# Patient Record
Sex: Female | Born: 1935 | Race: White | Hispanic: No
Health system: Southern US, Community
[De-identification: ages and names within clinical notes are randomized; demographics above are authoritative.]

---

## 2004-06-14 ENCOUNTER — Ambulatory Visit: Payer: Self-pay | Admitting: Internal Medicine

## 2005-05-31 ENCOUNTER — Ambulatory Visit: Payer: Self-pay | Admitting: Urology

## 2005-08-04 ENCOUNTER — Ambulatory Visit: Payer: Self-pay | Admitting: Internal Medicine

## 2006-08-07 ENCOUNTER — Ambulatory Visit: Payer: Self-pay | Admitting: Internal Medicine

## 2006-08-15 ENCOUNTER — Ambulatory Visit: Payer: Self-pay | Admitting: Internal Medicine

## 2007-11-07 ENCOUNTER — Ambulatory Visit: Payer: Self-pay | Admitting: Internal Medicine

## 2008-04-02 ENCOUNTER — Ambulatory Visit: Payer: Self-pay | Admitting: Internal Medicine

## 2008-12-03 ENCOUNTER — Ambulatory Visit: Payer: Self-pay | Admitting: Internal Medicine

## 2009-04-01 ENCOUNTER — Ambulatory Visit: Payer: Self-pay | Admitting: Orthopedic Surgery

## 2009-06-18 ENCOUNTER — Emergency Department: Payer: Self-pay | Admitting: Emergency Medicine

## 2009-12-16 ENCOUNTER — Ambulatory Visit: Payer: Self-pay | Admitting: Internal Medicine

## 2011-01-22 ENCOUNTER — Emergency Department: Payer: Self-pay | Admitting: Emergency Medicine

## 2011-06-08 ENCOUNTER — Inpatient Hospital Stay: Payer: Self-pay | Admitting: Specialist

## 2011-06-08 LAB — COMPREHENSIVE METABOLIC PANEL
Alkaline Phosphatase: 101 U/L (ref 50–136)
BUN: 21 mg/dL — ABNORMAL HIGH (ref 7–18)
Bilirubin,Total: 0.3 mg/dL (ref 0.2–1.0)
Calcium, Total: 8.8 mg/dL (ref 8.5–10.1)
Chloride: 101 mmol/L (ref 98–107)
Co2: 24 mmol/L (ref 21–32)
Creatinine: 1.2 mg/dL (ref 0.60–1.30)
EGFR (African American): 47 — ABNORMAL LOW
EGFR (Non-African Amer.): 41 — ABNORMAL LOW
Osmolality: 275 (ref 275–301)
SGOT(AST): 21 U/L (ref 15–37)
SGPT (ALT): 19 U/L
Total Protein: 7.7 g/dL (ref 6.4–8.2)

## 2011-06-08 LAB — CBC
HCT: 31.7 % — ABNORMAL LOW (ref 35.0–47.0)
MCH: 26.7 pg (ref 26.0–34.0)
MCHC: 31.9 g/dL — ABNORMAL LOW (ref 32.0–36.0)
Platelet: 314 10*3/uL (ref 150–440)
RDW: 15.3 % — ABNORMAL HIGH (ref 11.5–14.5)

## 2011-06-08 LAB — URINALYSIS, COMPLETE
Bacteria: NONE SEEN
Blood: NEGATIVE
Glucose,UR: NEGATIVE mg/dL (ref 0–75)
Ketone: NEGATIVE
Leukocyte Esterase: NEGATIVE
Nitrite: NEGATIVE
Protein: NEGATIVE
RBC,UR: 2 /HPF (ref 0–5)
Specific Gravity: 1.012 (ref 1.003–1.030)
Squamous Epithelial: <1
WBC UR: <1 /HPF (ref 0–5)

## 2011-06-08 LAB — PROTIME-INR
INR: 1.2
Prothrombin Time: 15.5 secs — ABNORMAL HIGH (ref 11.5–14.7)

## 2011-06-09 LAB — BASIC METABOLIC PANEL
Anion Gap: 12 (ref 7–16)
Calcium, Total: 8.3 mg/dL — ABNORMAL LOW (ref 8.5–10.1)
EGFR (African American): 46 — ABNORMAL LOW
EGFR (Non-African Amer.): 40 — ABNORMAL LOW
Glucose: 126 mg/dL — ABNORMAL HIGH (ref 65–99)
Osmolality: 271 (ref 275–301)
Potassium: 3.8 mmol/L (ref 3.5–5.1)

## 2011-06-09 LAB — CBC WITH DIFFERENTIAL/PLATELET
Basophil %: 0.6 %
Eosinophil %: 2.9 %
HCT: 29.6 % — ABNORMAL LOW (ref 35.0–47.0)
HGB: 9.3 g/dL — ABNORMAL LOW (ref 12.0–16.0)
Lymphocyte #: 1.4 10*3/uL (ref 1.0–3.6)
Lymphocyte %: 13.1 %
MCH: 26.8 pg (ref 26.0–34.0)
MCV: 85 fL (ref 80–100)
Monocyte #: 0.8 x10 3/mm (ref 0.2–0.9)
Neutrophil #: 8.3 10*3/uL — ABNORMAL HIGH (ref 1.4–6.5)
RBC: 3.48 10*6/uL — ABNORMAL LOW (ref 3.80–5.20)
RDW: 15.5 % — ABNORMAL HIGH (ref 11.5–14.5)
WBC: 10.9 10*3/uL (ref 3.6–11.0)

## 2011-06-10 LAB — CBC WITH DIFFERENTIAL/PLATELET
Basophil %: 0.5 %
Eosinophil %: 4.2 %
HCT: 25.4 % — ABNORMAL LOW (ref 35.0–47.0)
MCH: 26.9 pg (ref 26.0–34.0)
MCV: 85 fL (ref 80–100)
Monocyte %: 8.4 %
Platelet: 193 10*3/uL (ref 150–440)
WBC: 8.7 10*3/uL (ref 3.6–11.0)

## 2011-06-10 LAB — BASIC METABOLIC PANEL
Anion Gap: 10 (ref 7–16)
Calcium, Total: 7.6 mg/dL — ABNORMAL LOW (ref 8.5–10.1)
Chloride: 101 mmol/L (ref 98–107)
Co2: 24 mmol/L (ref 21–32)
EGFR (Non-African Amer.): 39 — ABNORMAL LOW
Glucose: 105 mg/dL — ABNORMAL HIGH (ref 65–99)
Osmolality: 273 (ref 275–301)
Potassium: 3.4 mmol/L — ABNORMAL LOW (ref 3.5–5.1)

## 2011-06-11 LAB — CBC WITH DIFFERENTIAL/PLATELET
Basophil #: 0.3 10*3/uL — ABNORMAL HIGH (ref 0.0–0.1)
Basophil %: 2.1 %
Eosinophil %: 2.5 %
HCT: 29.5 % — ABNORMAL LOW (ref 35.0–47.0)
HGB: 9.2 g/dL — ABNORMAL LOW (ref 12.0–16.0)
MCHC: 31.3 g/dL — ABNORMAL LOW (ref 32.0–36.0)
MCV: 84 fL (ref 80–100)
Monocyte #: 0.8 x10 3/mm (ref 0.2–0.9)
Monocyte %: 6.4 %
Neutrophil %: 74.7 %
RBC: 3.51 10*6/uL — ABNORMAL LOW (ref 3.80–5.20)
RDW: 15.7 % — ABNORMAL HIGH (ref 11.5–14.5)

## 2011-06-11 LAB — BASIC METABOLIC PANEL
Anion Gap: 11 (ref 7–16)
Calcium, Total: 8.4 mg/dL — ABNORMAL LOW (ref 8.5–10.1)
EGFR (African American): 49 — ABNORMAL LOW
EGFR (Non-African Amer.): 42 — ABNORMAL LOW
Osmolality: 275 (ref 275–301)
Sodium: 136 mmol/L (ref 136–145)

## 2011-08-06 ENCOUNTER — Emergency Department: Payer: Self-pay | Admitting: Emergency Medicine

## 2011-08-08 ENCOUNTER — Emergency Department: Payer: Self-pay | Admitting: Unknown Physician Specialty

## 2011-09-20 ENCOUNTER — Emergency Department: Payer: Self-pay | Admitting: *Deleted

## 2011-12-14 ENCOUNTER — Observation Stay: Payer: Self-pay | Admitting: Internal Medicine

## 2011-12-14 LAB — BASIC METABOLIC PANEL
Anion Gap: 5 — ABNORMAL LOW (ref 7–16)
BUN: 38 mg/dL — ABNORMAL HIGH (ref 7–18)
Calcium, Total: 9 mg/dL (ref 8.5–10.1)
Co2: 27 mmol/L (ref 21–32)
Creatinine: 1.29 mg/dL (ref 0.60–1.30)
EGFR (African American): 43 — ABNORMAL LOW
EGFR (Non-African Amer.): 37 — ABNORMAL LOW
Sodium: 134 mmol/L — ABNORMAL LOW (ref 136–145)

## 2011-12-14 LAB — URINALYSIS, COMPLETE
Bacteria: NONE SEEN
Glucose,UR: NEGATIVE mg/dL (ref 0–75)
Leukocyte Esterase: NEGATIVE
Ph: 6 (ref 4.5–8.0)
Protein: NEGATIVE
RBC,UR: 5 /HPF (ref 0–5)
Specific Gravity: 1.017 (ref 1.003–1.030)

## 2011-12-14 LAB — CBC
HCT: 33.5 % — ABNORMAL LOW (ref 35.0–47.0)
HGB: 10.8 g/dL — ABNORMAL LOW (ref 12.0–16.0)
MCV: 81 fL (ref 80–100)
Platelet: 326 10*3/uL (ref 150–440)
RBC: 4.14 10*6/uL (ref 3.80–5.20)
RDW: 16.4 % — ABNORMAL HIGH (ref 11.5–14.5)
WBC: 9 10*3/uL (ref 3.6–11.0)

## 2011-12-15 LAB — BASIC METABOLIC PANEL
Anion Gap: 8 (ref 7–16)
Calcium, Total: 8.9 mg/dL (ref 8.5–10.1)
Co2: 25 mmol/L (ref 21–32)
EGFR (African American): 47 — ABNORMAL LOW
EGFR (Non-African Amer.): 41 — ABNORMAL LOW
Glucose: 94 mg/dL (ref 65–99)
Osmolality: 283 (ref 275–301)

## 2011-12-15 LAB — CBC WITH DIFFERENTIAL/PLATELET
Basophil #: 0.1 10*3/uL (ref 0.0–0.1)
Eosinophil #: 0.2 10*3/uL (ref 0.0–0.7)
Eosinophil %: 2.8 %
Lymphocyte %: 25.9 %
MCH: 26.4 pg (ref 26.0–34.0)
MCHC: 32.8 g/dL (ref 32.0–36.0)
Monocyte #: 0.8 x10 3/mm (ref 0.2–0.9)
Neutrophil %: 60.9 %
Platelet: 267 10*3/uL (ref 150–440)
RBC: 3.47 10*6/uL — ABNORMAL LOW (ref 3.80–5.20)
RDW: 16 % — ABNORMAL HIGH (ref 11.5–14.5)
WBC: 8.5 10*3/uL (ref 3.6–11.0)

## 2011-12-16 LAB — CBC WITH DIFFERENTIAL/PLATELET
Basophil #: 0.1 10*3/uL (ref 0.0–0.1)
Basophil %: 0.7 %
Eosinophil #: 0.2 10*3/uL (ref 0.0–0.7)
HGB: 10.1 g/dL — ABNORMAL LOW (ref 12.0–16.0)
Lymphocyte #: 2.4 10*3/uL (ref 1.0–3.6)
MCH: 27.6 pg (ref 26.0–34.0)
MCHC: 34 g/dL (ref 32.0–36.0)
MCV: 81 fL (ref 80–100)
Monocyte #: 1.1 x10 3/mm — ABNORMAL HIGH (ref 0.2–0.9)
Neutrophil #: 6 10*3/uL (ref 1.4–6.5)
Neutrophil %: 60.9 %
Platelet: 263 10*3/uL (ref 150–440)
RBC: 3.68 10*6/uL — ABNORMAL LOW (ref 3.80–5.20)
RDW: 16.1 % — ABNORMAL HIGH (ref 11.5–14.5)

## 2012-09-19 LAB — BASIC METABOLIC PANEL
BUN: 29 mg/dL — ABNORMAL HIGH (ref 7–18)
Chloride: 101 mmol/L (ref 98–107)
Co2: 24 mmol/L (ref 21–32)
Glucose: 102 mg/dL — ABNORMAL HIGH (ref 65–99)
Osmolality: 272 (ref 275–301)
Sodium: 133 mmol/L — ABNORMAL LOW (ref 136–145)

## 2012-09-19 LAB — URINALYSIS, COMPLETE
Ketone: NEGATIVE
Nitrite: NEGATIVE
Protein: NEGATIVE
WBC UR: 108 /HPF (ref 0–5)

## 2012-09-19 LAB — CBC
HGB: 13.7 g/dL (ref 12.0–16.0)
MCH: 28.5 pg (ref 26.0–34.0)
MCV: 86 fL (ref 80–100)
RBC: 4.8 10*6/uL (ref 3.80–5.20)
RDW: 15.7 % — ABNORMAL HIGH (ref 11.5–14.5)

## 2012-09-20 ENCOUNTER — Observation Stay: Payer: Self-pay

## 2012-09-20 LAB — TROPONIN I: Troponin-I: 0.03 ng/mL

## 2012-09-20 LAB — CK TOTAL AND CKMB (NOT AT ARMC): CK, Total: 64 U/L (ref 21–215)

## 2012-09-21 LAB — CBC WITH DIFFERENTIAL/PLATELET
Basophil #: 0 10*3/uL (ref 0.0–0.1)
Basophil %: 0.7 %
Eosinophil #: 0.3 10*3/uL (ref 0.0–0.7)
Eosinophil %: 3.9 %
HCT: 36.5 % (ref 35.0–47.0)
HGB: 12.3 g/dL (ref 12.0–16.0)
Lymphocyte #: 1.9 10*3/uL (ref 1.0–3.6)
Lymphocyte %: 28.5 %
MCH: 29.2 pg (ref 26.0–34.0)
MCHC: 33.7 g/dL (ref 32.0–36.0)
Monocyte #: 0.6 x10 3/mm (ref 0.2–0.9)
Neutrophil %: 57.7 %
Platelet: 144 10*3/uL — ABNORMAL LOW (ref 150–440)
RBC: 4.2 10*6/uL (ref 3.80–5.20)
RDW: 16 % — ABNORMAL HIGH (ref 11.5–14.5)

## 2012-09-21 LAB — BASIC METABOLIC PANEL
Anion Gap: 6 — ABNORMAL LOW (ref 7–16)
Chloride: 106 mmol/L (ref 98–107)
EGFR (African American): 51 — ABNORMAL LOW
Glucose: 115 mg/dL — ABNORMAL HIGH (ref 65–99)
Osmolality: 274 (ref 275–301)
Potassium: 4.2 mmol/L (ref 3.5–5.1)
Sodium: 136 mmol/L (ref 136–145)

## 2012-09-22 LAB — URINE CULTURE

## 2014-05-06 NOTE — H&P (Signed)
PATIENT NAME:  Chelsea Bowen, Chelsea Bowen MR#:  [id number] DATE OF BIRTH:  1921/10/01  DATE OF ADMISSION:  12/14/2011  PRIMARY CARE PHYSICIAN: Clydie Braunavid Fitzgerald, MD, [facility]    CHIEF COMPLAINT: Head injury resulting in acute subarachnoid hemorrhage status post fall.   HISTORY OF PRESENT ILLNESS: This is an 81 year old female who lives at assisted living facility with recurrent history of falls who came in status post fall causing a head injury. CT scan showed acute subarachnoid hemorrhage. She is alert and she is oriented and without any new neurological deficits. She is answering questions appropriately. I spoke to the ER physician who stated that she spoke to the neurosurgeon at [facility] who did not recommend transfer. No procedure to be done for her. She is DNR/DNI. She is a high risk candidate. Therefore, she is not a surgical candidate. We were asked to admit the patient for observation and for comfort care.   REVIEW OF SYSTEMS: No history of fever, nausea, vomiting, diarrhea, chest pain, or shortness of breath.   PAST MEDICAL HISTORY:  1. History of recurrent falls. 2. History of hypertension. 3. History of glaucoma. 4. History of constipation. 5. History of anxiety/depression.   PAST SURGICAL HISTORY: Noncontributory.   FAMILY HISTORY: Noncontributory.   SOCIAL HISTORY: She lives at [facility] unassisted living facility. Not a smoker. Not a drinker.   PHYSICAL EXAMINATION:   VITAL SIGNS: Temperature 97.5, pulse 72, blood pressure 139/61, respiratory rate 22, 96% on room air.   GENERAL: This is an elderly female who is alert, who is oriented in no apparent distress.   HEENT: She has a contusion of the right forehead with acute laceration that has been repaired. Extraocular movements are intact. Pupils are equal and reactive to light and accommodation. Vision grossly intact. Normal oral exam without any trauma.   NECK: Supple. No tenderness to palpation of the cervical processes.    CARDIOVASCULAR: Regular rate and rhythm.   RESPIRATORY: Clear to auscultation bilaterally. No increased work of breathing.   ABDOMEN: Soft, nontender.   NEUROLOGICAL: 5 out of 5 strength throughout. Sensation grossly intact. No focal deficits.   SKIN: As stated above she has a laceration of her left forehead that has been repaired.   PERTINENT LABS: CT of the head showed an acute subarachnoid hemorrhage over the frontal and to lesser parietal convexities. No evidence of subdural, epidural, or parenchymal hemorrhage. There is no mass effect present. There is no evidence of acute skull fracture.   Sodium 134, potassium 4.6, creatinine 1.26, glucose 106, white blood cell count 9, hemoglobin 10.8, platelets 326. Urinalysis was negative.   ASSESSMENT AND PLAN:  1. Acute subarachnoid hemorrhage. The patient is not a surgical candidate. She is hemodynamically stable, neurologically stable at this time. Plan to admit for observation and for comfort care at this time. No intervention necessary. No further imaging at this time. Will try to evaluate her neurological status but will try to avoid neuro checks as we are trying to make her comfort as she is a DNR/DNI.  2. Hypertension. She is stable. Continue her home blood pressure regimen at this time.  3. Depression/anxiety. Continue her home sertraline.  4. Glaucoma. Continue home regimen.   DISPOSITION: She is being admitted for observation and for COMFORT CARE. Will decide whether or not to do physical therapy for evaluation. Family does not want her to have any physical therapy or plans to go to skilled nursing facility. They want her to go home as soon as  possible if she is stable overnight. Will reassess in the morning.   ____________________________ [name], MD kl:drc D: 12/14/2011 19:36:06 ET T: 12/15/2011 07:42:32 ET JOB#: [id number]  cc: [name], MD, <Dictator> [name] MD ELECTRONICALLY SIGNED 12/28/2011  12:17

## 2014-05-06 NOTE — Discharge Summary (Signed)
PATIENT NAME:  Chelsea Bowen, Chelsea Bowen MR#:  [id number] DATE OF BIRTH:  21-Nov-1921  DATE OF ADMISSION:  12/14/2011 DATE OF DISCHARGE:  12/16/2011  DISCHARGE DIAGNOSES: 1. Acute subarachnoid hemorrhage.  2. Hypertension.  3. Depression.  4. History of glaucoma.  5. History of osteoporosis.  6. Anxiety.   CHIEF COMPLAINT: Head injury resulting in subarachnoid hemorrhage.   HISTORY OF PRESENT ILLNESS: Chelsea Bowen is an 81 year old female lives in an assisted living facility, with history of recurrent falls and came in after a fall and after she sustained a head injury. CT scan showed evidence of acute subarachnoid hemorrhage; however, she was alert and oriented without any new neurological deficit. She was answering questions appropriately. She is a DO NOT RESUSCITATE, and she was also a high-risk surgical candidate.   PAST MEDICAL HISTORY: Significant for: 1. Hypertension.  2. Glaucoma.  3. Constipation.  4. Anxiety.  5. Depression.  6. Recurrent falls.   PHYSICAL EXAMINATION:  VITAL SIGNS: Temperature 97.5, pulse was 72, blood pressure 139/61, respirations 22, oxygen saturation 96% on room air.   GENERAL: She was not in distress. She had a contusion of the right forehead and a laceration which had been repaired on the right temporoparietal area.   NECK: Supple.   HEART: S1, S2.   ABDOMEN: Soft and nontender.   NEUROLOGIC: No focal deficits. Sensation was intact.   LABORATORY, DIAGNOSTIC AND RADIOLOGICAL DATA: CT scan of the head showed an acute subarachnoid hemorrhage of the frontal and lesser parietal convexities. No evidence of subdural epidural or parenchymal hemorrhage. There was no evidence of any mass effect. There was no evidence of any skull fracture. Sodium was 134, potassium 4.6, creatinine 1.26, glucose 106. WBC count 9, hemoglobin 10.8, platelets 326.0. Urinalysis was negative. A repeat hemoglobin was 10.1.   HOSPITAL COURSE: The patient was observed overnight and she appeared  to be stable during her stay. Discussions were held with family, who preferred to take her back to the assisted living facility. Her HCTZ was held because of hypotension.   DISCHARGE MEDICATIONS: She was advised to continue her home medications including: 1. Lisinopril 5 mg once a day.  2. Vitamin B12 1000 mcg once a day.  3. PreserVision 1 tablet b.i.d.  4. Colace 100 mg p.o. b.i.d.  5. Haldol 0.5 mg tablet every two to four hours p.Bowen.n. for agitation.  6. Ferrate  240 mg, one tablet a day.  7. Travatan eye drops, one drop each affected eye once a day.  8. Os-Cal 1500 mg b.i.d.  9. Sertraline 50 mg p.o. daily.   FOLLOWUP: She has been advised to follow up with Dr. Sampson GoonFitzgerald in one to two 2 weeks' time.   CONDITION ON DISCHARGE: The patient is stable at the time of discharge.    Total time spent in discharge and co ordination of care: 35 minutes ____________________________ Barbette ReichmannVishwanath Katelin Kutsch, MD vh:cbb D: 12/17/2011 11:51:50 ET T: 12/17/2011 12:09:26 ET JOB#: [id number] Barbette ReichmannVISHWANATH Deberah Adolf MD ELECTRONICALLY SIGNED 12/19/2011 13:52

## 2014-05-09 NOTE — Discharge Summary (Signed)
PATIENT NAME:  Chelsea Bowen, Carolynn R MR#:  [id number] DATE OF BIRTH:  03-08-1921  DATE OF ADMISSION:  09/20/2012 DATE OF DISCHARGE:  09/21/2012  DISCHARGE DIAGNOSES: 1.  Acute encephalopathy.  2.  Fall.  3.  Urinary tract infection.  4.  Dehydration.  5.  Leukocytosis.   HISTORY OF PRESENT ILLNESS: Please see admission history and physical for details. Briefly, this is an 81 year old patient with advanced dementia who lives at [facility]. She is usually unable to walk and uses a wheelchair. She has had increasing confusion and agitation. She had also fallen and hit her head when she fell from her wheelchair. She had CT scans and x-rays done with no obvious injury. However, she was noted to have a urinary tract infection and leukocytosis and tachycardia. She also had elevated BUN and creatinine ratio and was volume depleted.   HOSPITAL COURSE BY ISSUE:  1.  Urinary tract infection. She received ceftriaxone and responded well with a white count down from 14 to 6. She had no fevers. She will be discharged on 7 days of Cipro.  2.  Volume depletion. She responded well to IV fluids with normalization of her BUN and creatinine ratio. She is taking orals and will be encouraged to continue p.o.  3.  Acute delirium. This is on top of baseline advanced dementia. She was given Ativan in the ER, and the daughter reports that that was much better than the Haldol she has been using at her nursing facility. We will recommend changing her to Ativan at bedtime and also p.r.n. every 8 hours as needed for agitation.  4.  A-fib. Her heart rate was elevated but this stabilized. No further intervention was necessary. 5.  Pain. The patient is on hospice at the nursing facility. She was continued on her Norco as well as her morphine. Her pain was adequately controlled during the hospitalization.   DISCHARGE MEDICATIONS: 1.  Acetaminophen 325 mg 2 tablets every 8 hours as needed for pain.  2.  Morphine 20 mg/mL oral  solution 0.25 mL orally every hour as needed for pain.  3.  Norco 5/325 mg 1 tablet twice a day standing dose.  4.  Meloxicam 7.5 mg tablet once a day.  5.  Lisinopril 5 mg once a day.  6.  Milk of magnesia 30 mL orally once a day at bedtime.  7.  Maalox 30 mL orally 4 times a day as needed.  8.  Zoloft 50 mg 1 tablet at bedtime.  9.  Imodium 1 tablet as needed p.r.n. for diarrhea.  10.  Haldol 0.5 mg 1/2 tablet every 4 hours as needed for agitation.  11.  Lorazepam 0.5 mg 1 tablet every 8 hours as needed for anxiety.  12.  Lorazepam 0.5 mg standing dose at the evening at bedtime.  13.  Ciprofloxacin 500 mg twice a day for 7 days.  14.  Colace 100 mg 1 capsule twice a day as needed.  15.  Iophen-NR 10 mL orally every 6 hours as needed.  16.  Latanoprost ophthalmologic solution 0.005% one drop into each affected eye once a day.  17.  Dorzolamide/timolol 1 drop each eye twice a day.  18.  Vitamin B12 once a day.   DISCHARGE DIET: Regular diet with mechanical soft and chopped meats.   ACTIVITY: As tolerated.  DISCHARGE FOLLOWUP: The patient will follow up via phone and if she gets worse then family can bring her to my clinic. No scheduled followup needed given difficulty  getting around.  TIME SPENT ON DISCHARGE:  35 minutes.  ____________________________ Stann Mainland. Sampson Goon, MD dpf:sb D: 09/21/2012 08:49:03 ET T: 09/21/2012 09:14:58 ET JOB#: [id number]  cc: Stann Mainland. Sampson Goon, MD, <Dictator> Tesa Meadors Sampson Goon MD ELECTRONICALLY SIGNED 09/23/2012 20:43

## 2014-05-09 NOTE — H&P (Signed)
PATIENT NAME:  Chelsea Bowen, Chelsea Bowen MR#:  [id number] DATE OF BIRTH:  05-28-1921  DATE OF ADMISSION:  09/20/2012  PRIMARY CARE PHYSICIAN:  Dr. Sampson [facility]Fitzgerald from [facility].  REFERRING PHYSICIAN:  Dr. Ladona Ridgelaylor.   CHIEF COMPLAINT:  Fall.  HISTORY OF PRESENT ILLNESS:  The patient is an 81 year old Caucasian female who is residing in assisted living facility, was sent over to the ER for right hip pain after she lost balance in wheelchair and fell forward onto the face and then onto the right hip.  The patient was crying since the episode of fall.  In the ER, the patient had CAT scan of the head, CT of the cervical spine with no acute abnormalities.  Also, she had x-ray of the pelvic area with no acute fractures, other than chronic old changes including avascular necrosis.  The patient is diagnosed with acute cystitis and sinus tachycardia.  She has received IV Rocephin and IV fluids.  No significant improvement in the sinus tachycardia.  Hospitalist team is called to admit the patient.  According to the patient's daughter at bedside, though patient has baseline dementia, today she is more confused and speech is not comprehensive which was assumed to be from urinary tract infection.  I was unable to get any history from the patient as she has baseline dementia and more confusion today.  According to the daughter, the patient was not complaining of any chest pain or shortness of breath.   PAST MEDICAL HISTORY:  Recurrent falls, hypertension, glaucoma, constipation, anxiety, depression.   PAST SURGICAL HISTORY:  Noncontributory and unobtainable as well.   ALLERGIES:  The patient is allergic to CODEINE, SULFA, TRAMADOL.   PSYCHOSOCIAL HISTORY:  Resides at [facility] in assisted living facility.  No history of smoking or alcohol.   FAMILY HISTORY:  Unobtainable.   REVIEW OF SYSTEMS:  Unobtainable.   PHYSICAL EXAMINATION: VITAL SIGNS:  Temperature 98.3, pulse initially 120, during my examination it was  at around 106, respirations 20, blood pressure 110/58, pulse ox 100%.  GENERAL APPEARANCE:  Not under acute distress, resting comfortably, arousable, but not answering any questions.  It was assumed to be from worsening of her baseline dementia.  HEENT:  Normocephalic.  Pupils are equally reacting to light and accommodation.  Bruising is noticed on the forehead.  No sinus tenderness.  Moist mucous membranes.  NECK:  Supple.  No JVD.  No thyromegaly.  LUNGS:  Clear to auscultation bilaterally.  No accessory muscle usage.  No anterior chest wall tenderness on palpation.  CARDIAC:  S1, S2 normal.  Regular rate and rhythm.  GASTROINTESTINAL:  Soft.  Bowel sounds are positive in all four quadrants.  Nontender, nondistended.  No hepatosplenomegaly.  NEUROLOGIC:  Arousable, but disoriented, not following verbal commands.  Motor and sensory could not be elicited as patient is with altered mental status.  Reflexes are 2+.  EXTREMITIES:  No edema.  No cyanosis.   SKIN:  Warm to touch.  Normal turgor.  No rashes.   LABORATORY AND IMAGING STUDIES:  CAT scan of the cervical spine without contrast, minimal compression T1 vertebral body which may be old and no acute cervical spine fracture.  CT of the head, no acute abnormality.  X-ray of the pelvis, no acute abnormality, chronic changes with avascular necrosis right hip.  These changes are present, stable from prior study from 08/06/2011.  Left elbow, no acute abnormality.  Right elbow, no acute abnormality.  Right hip no acute abnormality, avascular necrosis and degenerative changes  which are stable from prior study July 2013.  Chest x-ray, no acute bony abnormality.  Troponin less than 0.02.  WBC 14.1, hemoglobin 13.7, hematocrit 41.5, platelets 198, PT 16.1, INR 1.3.  Urinalysis, hazy in appearance, ketones negative, glucose negative, nitrite negative, leukocyte esterase 3+.  Glucose 102, BUN 29, creatinine 1.11, sodium 133, potassium 4.1, chloride 101, CO2 24,  anion gap 8.  GFR 44, serum osmolality 272, calcium 9.3.  A 12 lead EKG, sinus tachycardia with PR interval at ventricular rate 122, normal PR and QRS intervals, nonspecific ST-T wave changes with possible lateral infarct of age undetermined.   ASSESSMENT AND PLAN:  1.  Altered mental status, probably from acute cystitis on baseline dementia.  We will admit her to telemetry.  We will get neuro checks.  Urine culture is ordered.  We will provide her IV Rocephin.  2.  Sinus tachycardia, probably from anxiety and dehydration, significantly improved with IV fluids.  Continue close monitoring on telemetry.  3.  Hypertension which is chronic in nature.  The patient's blood pressure is low-normal.  We will continue close monitoring and hold her home medications as patient is with altered mental status.  4.  Chronic, anxiety.  We will provide her anxiolytics as needed basis.  5.  We will provide her GI and DVT prophylaxis.  6.  She is DO NOT RESUSCITATE.  Daughter and son are medical power of attorney.   Diagnosis and plan of care was discussed in detail with the patient's daughter at bedside including imaging study results.  She is aware of the chronic results from July 2014.  The patient will be transferred to Dr. Sampson Goon in a.m.   Total time spent on admission is 50 minutes.    ____________________________ Ramonita Lab, MD ag:ea D: 09/20/2012 03:27:16 ET T: 09/20/2012 03:56:22 ET JOB#: [id number]  cc: Ramonita Lab, MD, <Dictator> Ramonita Lab MD ELECTRONICALLY SIGNED 09/21/2012 7:03

## 2014-05-11 NOTE — Consult Note (Signed)
Brief Consult Note: Diagnosis: HTN, glaucoma, Osteoporosis, L1 compression fracture mild, s/p fall with eft femoral subcapital fracture.   Patient was seen by consultant.   Consult note dictated.   Recommend to proceed with surgery or procedure.   Orders entered.   Comments: 1. will start po BB in the perioperative period 2. Dvt prophylaxis per ortho 3. cont po home meds 4.pt does not have CAD per dtr, EKG Normal. pt ambuates using walker. denise cp and sob -pt is at low risk for surgery -d/w dtr risk's and complications.  Electronic Signatures: Willow OraPatel, Breianna Delfino A (MD)  (Signed [phone]-May-13 11:58)  Authored: Brief Consult Note   Last Updated: 22-May-13 11:58 by Willow OraPatel, Sedonia Kitner A (MD)

## 2014-05-11 NOTE — Discharge Summary (Signed)
PATIENT NAME:  Delorise JacksonMOSER, Roshini R MR#:  [id number] DATE OF BIRTH:  August 09, 1921  DATE OF ADMISSION:  06/08/2011 DATE OF DISCHARGE:  06/11/2011  FINAL DIAGNOSES:  1. Impacted subcapital fracture of the left hip.  2. Advanced dementia.  3. Hypertension.  4. Glaucoma.  5. Osteoporosis. 6. History of depression. 7. History kidney stones.  8. Mild compression fracture of L1.  PROCEDURES: Left hip percutaneous pinning, 06/08/2011.  CONSULTANTS: [facility].   COMPLICATIONS: None.   DISCHARGE MEDICATIONS:  1. Cosopt eyedrops to both eyes twice a day. 2. Lisinopril 5 mg daily.  3. Lopressor 12.5 mg every 12 hours.  4. Multivitamin with minerals.  5. Omega fatty acids. 6. Zoloft 100 mg daily.  7. Travatan 0.004% ophthalmic drops to both eyes at bedtime. 8. Fosamax 70 mg weekly. 9. Miacalcin nasal spray daily. 10. Haldol 0.25 mg four times daily and p.r.n. 11. Norco 5/325 mg p.r.n. pain.  12. Enteric-coated aspirin one p.o. twice a day. 13. Iron 1 p.o. daily.  14. Potassium 10 mEq twice a day. 15. Risperdal 0.5 mg at bedtime.   HISTORY OF PRESENT ILLNESS: The patient is an 81 year old female who fell at assisted living early in the morning of admission. She was brought to the emergency room where exam and x-rays revealed a slightly impacted subcapital fracture of the left hip. She also had some low back pain and x-rays showed very mild compression of L1. The patient was brought to the emergency room and underwent medical evaluation and was cleared for surgery. She was admitted for pinning of the fracture. The risks and benefits and postoperative protocol were discussed with the patient's son and daughter. They agreed to proceed.   PAST MEDICAL HISTORY: Illnesses: As above.   ALLERGIES: Codeine, sulfa, and tramadol.  HOME MEDICATIONS: As listed above.   FAMILY HISTORY: Positive for hypertension.  REVIEW OF SYSTEMS: Unremarkable.   SOCIAL HISTORY: The patient stays at [facility] in  assisted living. She does not smoke or drink. She usually uses a walker.  PHYSICAL EXAMINATION: The patient was alert and cooperative but confused. She was unable to answer most questions. Vital signs were normal. The back was mildly tender. The neurovascular status was good distally in both lower extremities. The left hip was painful to move. It was not really short or rotated. Neurovascular status was good distally.   RESULTS: Laboratory data on admission was satisfactory.   HOSPITAL COURSE: The patient was taken to surgery later the day of admission and underwent percutaneous pinning of the left hip. She did well postoperatively. Hemoglobin was only 10.1 on admission and dropped to 8.0 on the second postoperative day. She was treated with iron. She was not really ambulatory and vital signs were normal, so I did not feel that a transfusion was indicated. The patient was gotten out of bed and started on transfers. She is      stable and able to be transferred to skilled nursing on 06/11/2011. She is to be touchdown weight-bearing for at least six weeks. She will return to my office in two weeks for exam and x-ray.  ____________________________ Valinda HoarHoward E. Aryianna Earwood, MD hem:slb D: 06/10/2011 13:56:00 ET T: 06/10/2011 14:18:11 ET JOB#: [id number]  cc: Don C. Candelaria Stagershaplin, MD Valinda HoarHoward E. Lakeria Starkman, MD, <Dictator> Valinda HoarHOWARD E Rand Boller MD ELECTRONICALLY SIGNED 06/11/2011 13:11

## 2014-05-11 NOTE — H&P (Signed)
Subjective/Chief Complaint Pain left hip    History of Present Illness 81 year old female with dementia fell at assisted living early this am injuring the left hip and back.  Brought to Emergency Room where exam and X-rays show an impacted subcapital fracture on the left and possible mild L1 compression fracture. Admit for medical evaluation and surgical fixation of the fracture. Risks and benefits of surgery were discussed at length including but not limited to infection, non union, nerve or blood vessed damage, non union, need for repeat surgery, blood clots and lung emboli, and death. Daughter is present for this and understands and has viewed the X-rays.  Plan to percutaneous pin fracture today.   Past Med/Surgical Hx:  Osteoporosis:   Depression:   Glaucoma:   Dementia:   Hypertension:   ALLERGIES:  Sulfa drugs: Rash  Codeine: N/V/Diarrhea  Tramadol: Unknown  HOME MEDICATIONS: Medication Instructions Status  lisinopril 5 mg oral tablet 1  orally once a day  Active  hydrochlorothiazide 25 mg oral tablet 1  orally once a day  Active  Calcium 600+D   once a day  Active  dorzolamide-timolol ophthalmic 2%-0.5% solution 1  drops to each affected eye 2 times a day  Active  Travatan Z 0.004% ophthalmic solution 1  drops to each affected eye once a day (at bedtime)  Active  PreserVision   2 times a day  Active  Ferate 240 mg (27 mg elemental iron) oral tablet 1 tab(s) orally once a day Active  loratadine 10 mg oral tablet 1 tab(s) orally once a day Active  acetaminophen 325 mg oral tablet 2 tab(s) orally , As Needed Active  Tylenol Extra Strength PM 500 mg-25 mg oral tablet 1 tab(s) orally once a day (at bedtime) Active  Zoloft 25 mg oral tablet 100 milligram(s) orally once a day Active  Fish Oil 1000 mg oral capsule 1 cap(s) orally once a day Active   Family and Social History:   Family History Non-Contributory    Social History negative tobacco, negative ETOH    Place of Living  assisted living   Review of Systems:   Fever/Chills No    Cough No    Sputum No    Abdominal Pain No   Physical Exam:   GEN thin    HEENT pink conjunctivae    NECK supple    RESP normal resp effort    CARD regular rate    ABD denies tenderness    GU foley catheter in place    LYMPH negative neck    EXTR negative edema, Left leg painful on rotation.  No shortening. circulation/sensation/motor function good.   Tender mid back.  No bruising noted.    SKIN normal to palpation    NEURO motor/sensory function intact    PSYCH alert, poor insight   Routine Chem:  22-May-13 05:41    Glucose, Serum 94   BUN 21   Creatinine (comp) 1.20   Sodium, Serum 136   Potassium, Serum 4.2   Chloride, Serum 101   CO2, Serum 24   Calcium (Total), Serum 8.8  Hepatic:  22-May-13 05:41    Bilirubin, Total 0.3   Alkaline Phosphatase 101   SGPT (ALT) 19   SGOT (AST) 21   Total Protein, Serum 7.7   Albumin, Serum 3.2  Routine Chem:  22-May-13 05:41    Osmolality (calc) 275   eGFR (African American) 47   eGFR (Non-African American) 41   Anion Gap 11  Routine Hem:  22-May-13 05:41    WBC (CBC) 9.4   RBC (CBC) 3.79   Hemoglobin (CBC) 10.1   Hematocrit (CBC) 31.7   Platelet Count (CBC) 314   MCV 84   MCH 26.7   MCHC 31.9   RDW 15.3  Routine Coag:  22-May-13 05:41    Prothrombin 15.5   INR 1.2   Radiology Results: XRay:    05-Jan-13 17:30, Hip Left Complete   Hip Left Complete   REASON FOR EXAM:    fall, left hip pain  COMMENTS:    PROCEDURE:     DXR - DXR HIP LEFT COMPLETE  - Jan 22 2011  5:30PM    RESULT:     Images of the left hip show degenerative change with joint   space narrowing and subchondral sclerosis in the acetabular region. No   fracture or dislocation is evident. No femoral head deformity is seen.   Included portion of the pubic bones appear to be intact. Degenerative SI   joint changes are noted.     IMPRESSION:    DJD of the left hip. No  acute bony abnormality evident.    Thank you for the opportunity to contribute to the care of your patient.           Verified By: Sundra Aland, M.D., MD    22-May-13 06:50, Hip Left Complete   Hip Left Complete   REASON FOR EXAM:    pain sp fall  COMMENTS:       PROCEDURE: DXR - DXR HIP LEFT COMPLETE  - Jun 08 2011  6:50AM     RESULT: AP and lateral views of the left hip are submitted. The images   are limited. There may be an impacted fracture of the subcapital region   of the left femur. The left hemipelvis is grossly intact.    IMPRESSION:  I am worried that there is an impacted subcapital fracture   of the left hip.          Verified By: DAVID A. Martinique, M.D., MD    22-May-13 06:50, Lumbar Spine AP and Lateral   Lumbar Spine AP and Lateral   REASON FOR EXAM:    fall with low back pain  COMMENTS:       PROCEDURE: DXR - DXR LUMBAR SPINE AP AND LATERAL  - Jun 08 2011  6:50AM     RESULT: The lumbar vertebral bodies are preserved in height with the   exception of L1 where there is mild anterior wedging. There is mild disc   space narrowing at L4-L5 and L3-L4.  I see no spondylolisthesis. There is gentle curvature of the lumbar spine   with the convexity toward the right. Severe degenerative change of the   right hip is visible at the margin of the film.    IMPRESSION:    1. There is mild wedge compression of the body of L1 with loss of height   anteriorly of approximately 10 to 15%. No retropulsion of bone is seen.  2. There is disc space narrowing at L3-L4 and at L4-L5.          Verified By: DAVID A. Martinique, M.D., MD     Assessment/Admission Diagnosis Impacted left subcapital hip fracture. Possible L1 compression fracture  Dementia    Plan Percutaneous pinning left hip.   Electronic Signatures: Park Breed (MD)  (Signed [phone] 13:20)  Authored: CHIEF COMPLAINT and HISTORY, PAST MEDICAL/SURGIAL HISTORY, ALLERGIES, HOME  MEDICATIONS, FAMILY AND  SOCIAL HISTORY, REVIEW OF SYSTEMS, PHYSICAL EXAM, LABS, Radiology, ASSESSMENT AND PLAN   Last Updated: 22-May-13 13:20 by Park Breed (MD)

## 2014-05-11 NOTE — Op Note (Signed)
PATIENT NAME:  Chelsea Bowen, Chelsea Bowen MR#:  [id number] DATE OF BIRTH:  05-Mar-1921  DATE OF PROCEDURE:  06/08/2011  PREOPERATIVE DIAGNOSIS: Impacted subcapital fracture of the left hip.   POSTOPERATIVE DIAGNOSIS: Impacted subcapital fracture of the left hip.   OPERATION: Percutaneous pinning of left hip (three Synthes 7.3 mm cannulated screws).   SURGEON: Valinda HoarHoward E. Josiel Gahm, MD    ANESTHESIA: Spinal.   COMPLICATIONS: None.   DRAINS: None.   ESTIMATED BLOOD LOSS: Minimal.   REPLACEMENTS: None.   OPERATIVE PROCEDURE: The patient was brought to the operating room where she underwent satisfactory spinal anesthesia and was placed in the supine position on the fracture table. The right leg was flexed and abducted and the left leg was placed in gentle traction and internally rotated slightly. Fluoroscopy showed the fracture remained in excellent alignment. The hip was prepped and draped in a sterile fashion and stab wounds made laterally. Guide pins were inserted into the neck and head of the femur under fluoroscopic control. These were measured and three 7.3 cannulated Synthes screws were inserted and tightened snugly. These measured 75 to 80 mm in length. The guide pins were removed. Final x-rays showed good positioning. The stab wounds were closed with 3-0 nylon and dry sterile dressing applied. The patient was transferred to her hospital bed and taken to recovery in good condition. She had good motion of the hip without crepitus.   ____________________________ Valinda HoarHoward E. Marji Kuehnel, MD hem:drc D: 06/08/2011 15:31:06 ET T: 06/08/2011 16:05:24 ET JOB#: [id number] Valinda HoarHOWARD E Byard Carranza MD ELECTRONICALLY SIGNED 06/09/2011 10:51

## 2014-05-11 NOTE — Consult Note (Signed)
PATIENT NAME:  Chelsea Bowen, Chelsea Bowen MR#:  [id number] DATE OF BIRTH:  [birth year]  DATE OF CONSULTATION:  06/08/2011  REFERRING PHYSICIAN:  Deeann Saint, MD  CONSULTING PHYSICIAN:  Marcquis Ridlon A. Allena Katz, MD  PRIMARY CARE PHYSICIAN: Conchita Paris, MD   REASON FOR CONSULTATION: Preop medical clearance.   HISTORY OF PRESENT ILLNESS: Chelsea Bowen is an 81 year old Caucasian female who is a resident at [facility] who has history of Alzheimer's dementia, hypertension, osteoporosis, and glaucoma. She was brought to the Emergency Room after staff found the patient in the bathroom complaining of left hip pain. The patient apparently had fallen in the morning when she tried to use the bathroom. She normally walks using a walker. Staff was not aware of the patient going in the bathroom in the morning hours. They went on their routine rounds and found approximately an hour and a half to two hours later that she was found in the bathroom. Next, in the Emergency Room the patient appears hemodynamically stable. She is complaining of left hip pain. She received a couple of doses of IV morphine. Internal Medicine was consulted for preop medical clearance. The patient's daughter is present in the Emergency Room. She reports the patient does not have any history of coronary artery disease. The patient herself denies any chest pain. Her EKG is normal sinus rhythm.   PAST MEDICAL HISTORY:  1. Hypertension.  2. Alzheimer's dementia.  3. Glaucoma.  4. Osteoporosis.  5. History of depression.  6. History of kidney stones in the past.   PAST SURGICAL HISTORY:  1. Appendectomy.  2. Hysterectomy.  3. Kidney stone removal.   ALLERGIES: Tramadol and sulfa.    MEDICATIONS:  1. Ferate  28 mg 1 p.o. daily.  2. Fish Oil 1000 mg p.o. daily.  3. Hydrochlorothiazide 25 mg daily.  4. Lisinopril 5 mg daily.  5. Tylenol 325 mg 2 tablets p.o. daily.  6. Zoloft 100 mg p.o. daily.  7. Caltrate 600 Plus Vitamin D 400 mg b.i.d.  8. Cosopt 2%  one drop b.i.d.  9. PreserVision one b.i.d.  10. Tylenol PM Extra-Strength two at bedtime.  11. Travatan 0.004% one drop both eyes daily.  12. Pepto-Bismol 15 mL q.4 p.r.n.  13. Compazine 25 mg suppository rectally for nausea and vomiting.   FAMILY HISTORY: Positive for hypertension.   SOCIAL HISTORY: The patient is a resident at [facility]. Nonsmoker. Nonalcoholic. She is functional with walking using a walker.  REVIEW OF SYSTEMS: Not much is obtainable since the patient has dementia. She keeps smiling most of the time. She denies any chest pain or shortness of breath. However, she does point to the left hip where it hurts her.   PHYSICAL EXAMINATION:   GENERAL: The patient is awake, alert, oriented x3, not in acute distress.   VITAL SIGNS: She is afebrile, pulse 64, regular, blood pressure 123/80, sats are 93% on room air.   HEENT: Atraumatic, normocephalic. Pupils equal, round, and reactive to light and accommodation. Extraocular movements intact. Oral mucosa is moist.   NECK: Supple. No JVD. No carotid bruit.   RESPIRATORY: Clear to auscultation bilaterally. No rales, rhonchi, respiratory distress, or labored breathing.   CARDIOVASCULAR: Both the heart sounds are normal. Rate, rhythm is regular. PMI not lateralized. Chest nontender. Good pedal pulses. Good femoral pulses. No lower extremity edema.    EXTREMITIES: The patient's left hip appears to be somewhat rotated.   SKIN: Warm and dry.   NEUROLOGIC: Unable to assess much. The patient moves  all the extremities other than her left lower extremity. She has dementia. Unable to do full detailed neurologic exam, however grossly appears nonfocal.   PSYCH: The patient is awake. She is alert. She is oriented x1.   LABORATORY, DIAGNOSTIC, AND RADIOLOGICAL DATA: Chest x-ray stable cardiomegaly.  Thoracic spine no acute bony abnormality.   Lumbar AP spine shows mild wedge compression fracture of body of L1 with loss of height  anteriorly of approximately 10 to 15%. No retropulsion of the bone is seen.    Left hip shows impacted subcapital fracture of the left femur.   CT of the head no evidence of acute intracranial hemorrhage. There are age-related atrophic changes consistent with chronic small vessel ischemia.   Comprehensive metabolic panel within normal limits. Albumin 3.2, BUN 21, white count 9.4, hemoglobin and hematocrit 10.1 and 31.7, platelet count 314. PT-INR 15.5 and 1.2.   EKG is normal sinus rhythm.   ASSESSMENT: 81 year old Chelsea Bowen with:  1. Hypertension. Will start patient on beta-blockers in the perioperative period. Will resume lisinopril. I will hold off on hydrochlorothiazide for now. If the patient tolerates and blood pressure is stable after surgery can reintroduce hydrochlorothiazide.  2. DVT prophylaxis.  3. Glaucoma. Continue eyedrops Cosopt and Travatan.  4. Osteoporosis. The patient is on Calcium with Vitamin D. Will continue that.  5. L1 compression fracture, mild. P.r.n. pain management physical therapy after surgery.  6. Status post fall with left femoral subcapital fracture. The patient does not have coronary artery disease per daughter. EKG normal. Ambulates well. Is very functional otherwise. Denies any chest pain or shortness of breath. She is at low risk for surgery. Okay to proceed with surgery. Above was discussed with the patient's daughter.   Further work-up according to the patient's clinical course. Thank you for the consult. Will follow while the patient is in-house.   TIME SPENT: 50 minutes.  ____________________________ Wylie HailSona A. Allena KatzPatel, MD sap:drc D: 06/08/2011 12:16:48 ET T: 06/08/2011 12:41:37 ET JOB#: [id number]  cc: Xai Frerking A. Allena KatzPatel, MD, <Dictator> Jimmie Mollyon C. Candelaria Stagershaplin, MD Valinda HoarHoward E. Miller, MD Willow OraSONA A [place] MD ELECTRONICALLY SIGNED 06/17/2011 22:47

## 2015-06-21 IMAGING — CT CT HEAD WITHOUT CONTRAST
3 of 4 series · 17 of 30 positions shown, 19 images · non-contrast
Comparison: none

REASON FOR EXAM: fall dementia with contusion
COMMENTS:

PROCEDURE:     CT  - CT HEAD WITHOUT CONTRAST  - September 19, 2012  [DATE]
RESULT:     History: Fall.
Comparison Study: CT 12/14/2011.

[Series 2: without · axial · non-contrast · 0.41mm/px · z∈[+522,+618]mm · 5 of 29 slices shown (1 of 2)]
[im 5/29  brain]
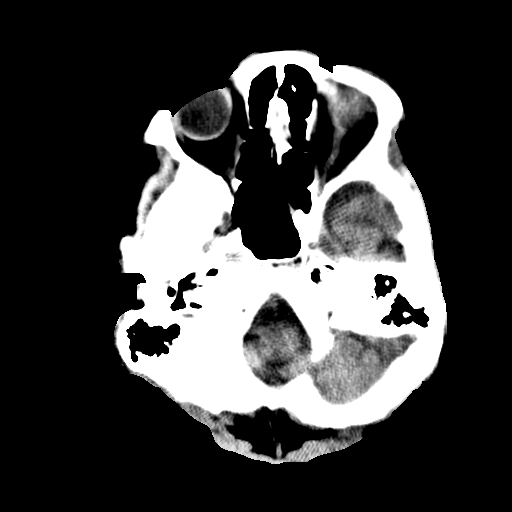
[im 10/29  brain]
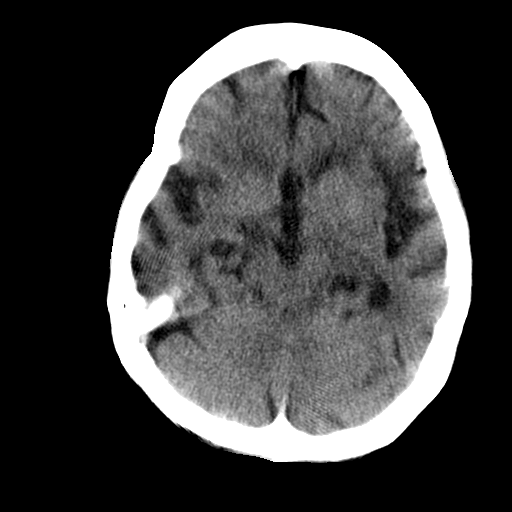
[im 15/29  brain]
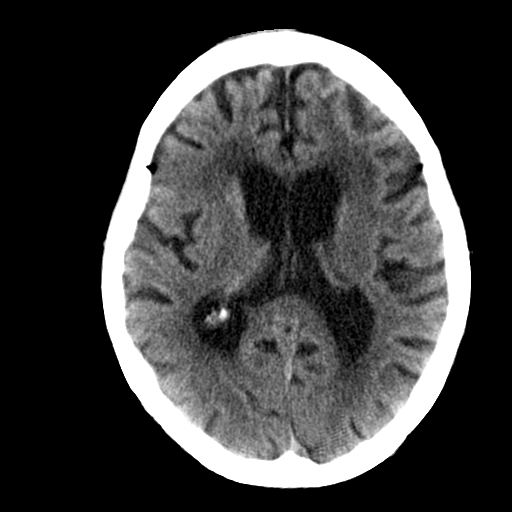
[im 19/29  brain]
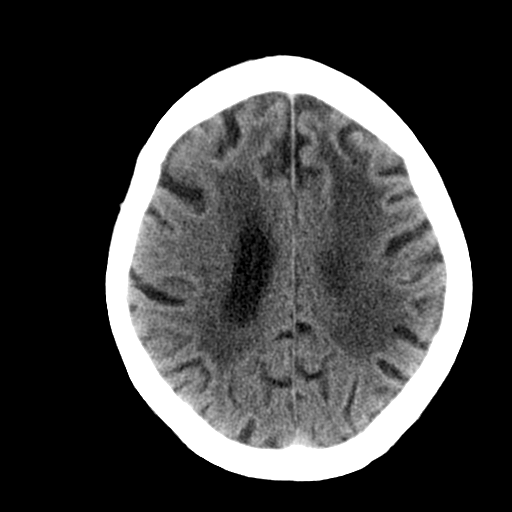
[im 24/29  brain]
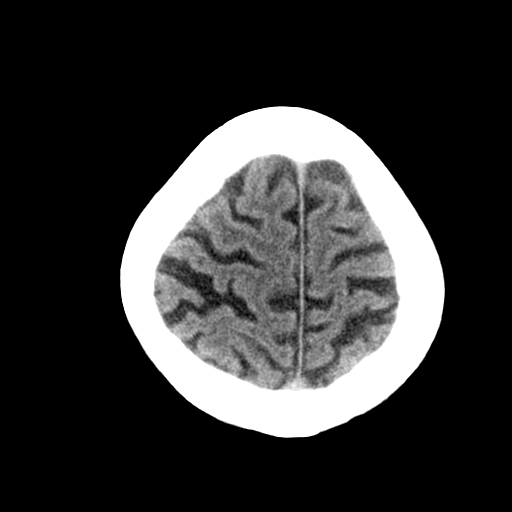

[Series 4: without · axial · non-contrast · 0.41mm/px · z∈[+522,+622]mm · 6 of 29 slices shown, 8 images (2 of 2)]
[im 5/29  brain]
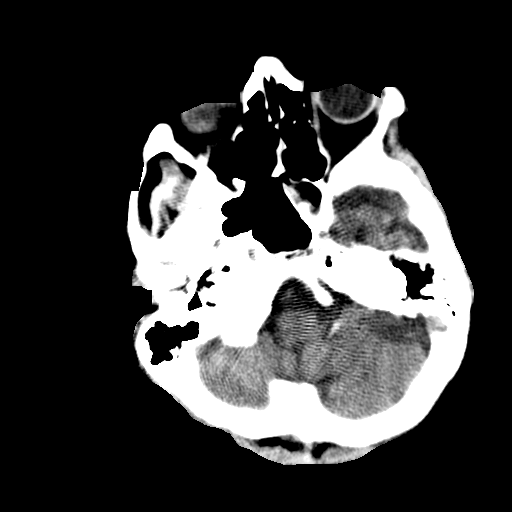
[im 5/29  bone]
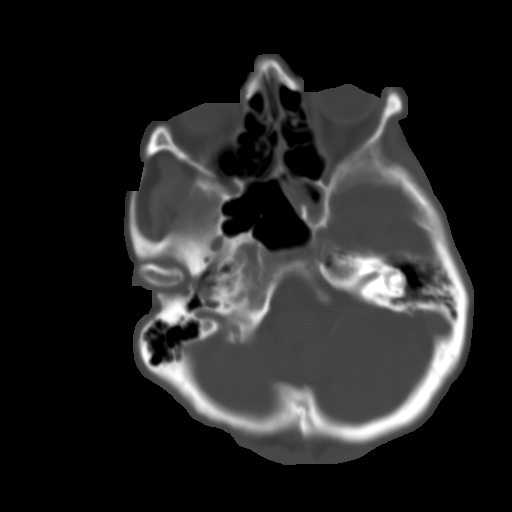
[im 9/29  brain]
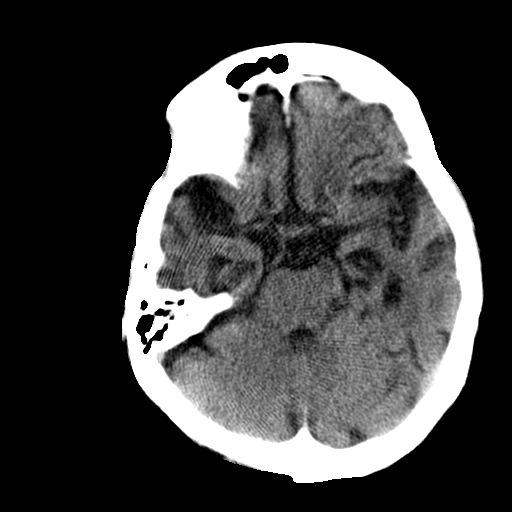
[im 13/29  brain]
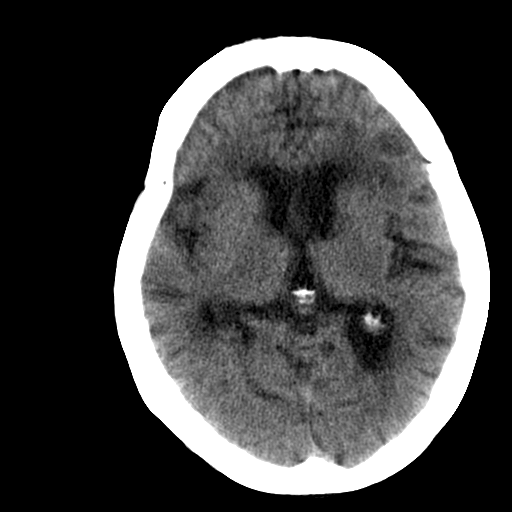
[im 17/29  brain]
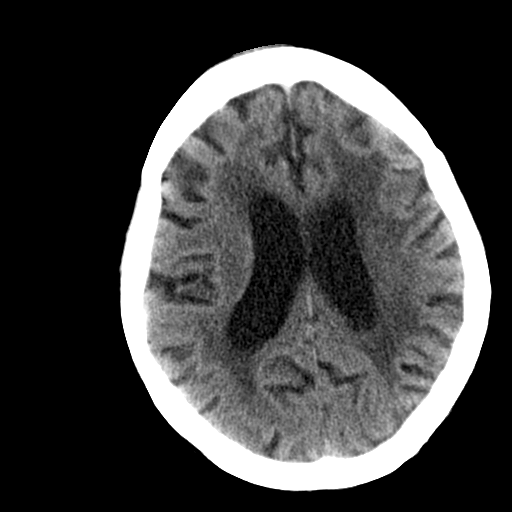
[im 21/29  brain]
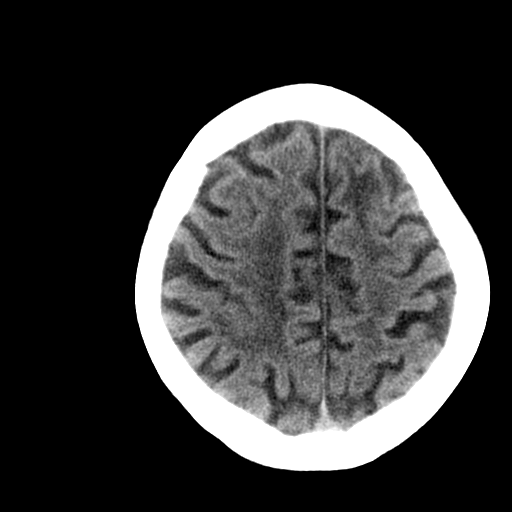
[im 21/29  bone]
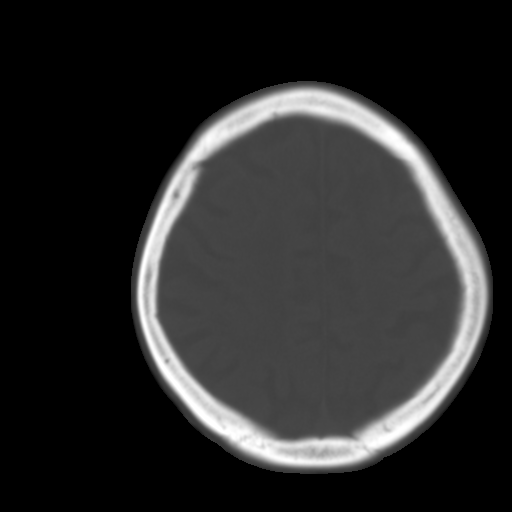
[im 25/29  brain]
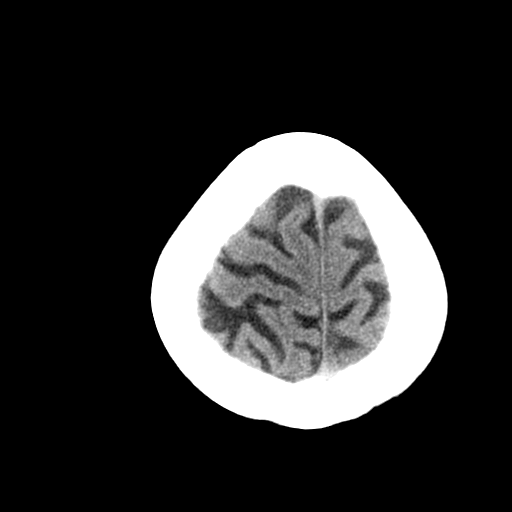

[Series 5: bone · axial · 0.41mm/px · z∈[+522,+622]mm · 6 of 29 slices shown]
[im 5/29  bone]
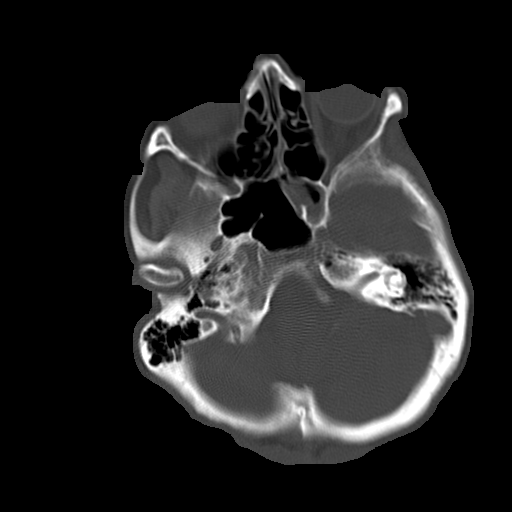
[im 9/29  bone]
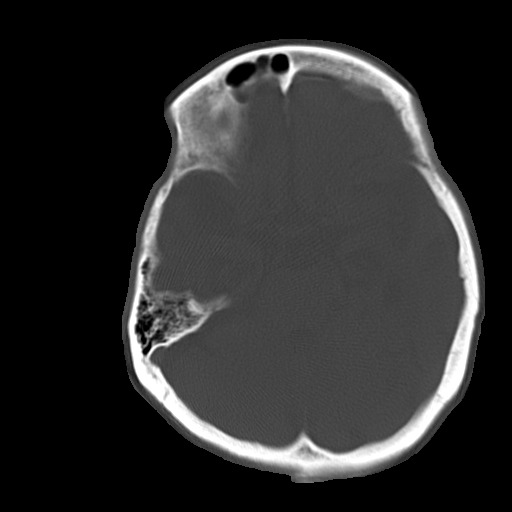
[im 13/29  bone]
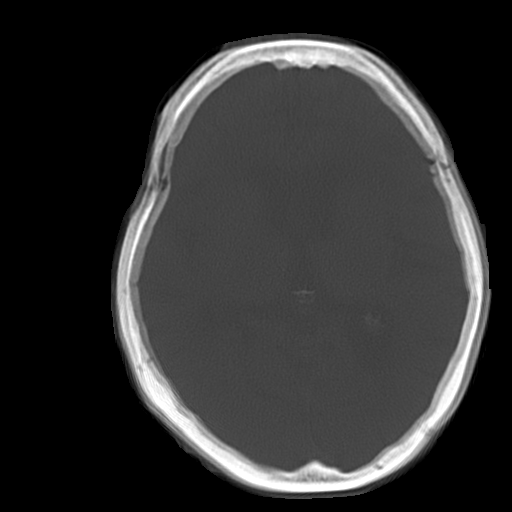
[im 17/29  bone]
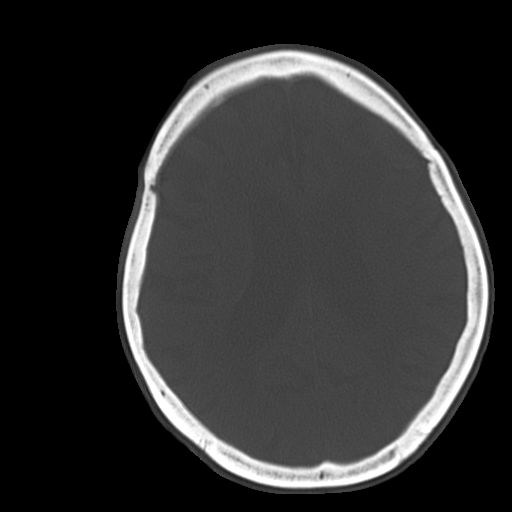
[im 21/29  bone]
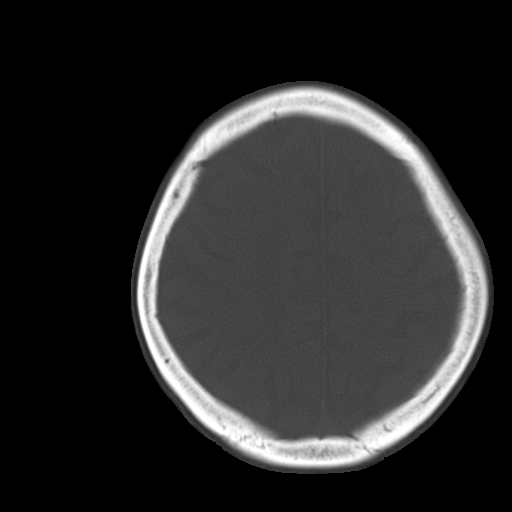
[im 25/29  bone]
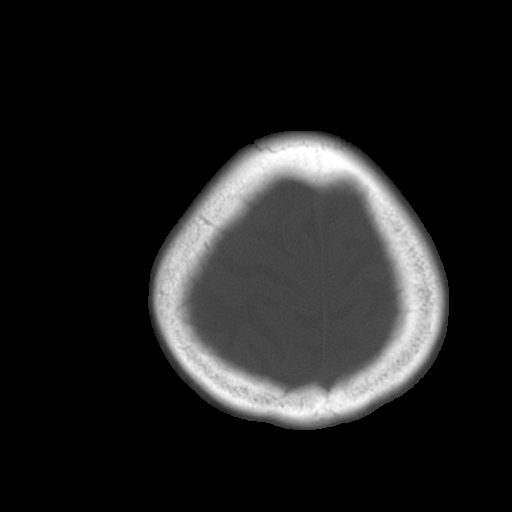

[17 of 30 positions shown; findings below may reference images not displayed]

FINDINGS: Standard nonenhanced CT obtained. No mass. No hydrocephalus. No
hemorrhage. Please identified hemorrhage noted on CT of 0234 has resolved.
White matter changes consistent with chronic ischemia noted. Diffuse atrophy
present. No hydrocephalus. No acute bony abnormality. Mild mucosal
thickening ethmoid sinuses.
IMPRESSION: No acute abnormality.

## 2015-06-21 IMAGING — CT CT CERVICAL SPINE WITHOUT CONTRAST
2 series · 10 of 14 positions shown, 12 images · non-contrast
Comparison: none

REASON FOR EXAM: fall dementia
COMMENTS:

PROCEDURE:     CT  - CT CERVICAL SPINE WO  - September 19, 2012  [DATE]
RESULT:     History: Fall. Dementia.
Comparison Study: CT cervical spine 09/21/2011. Thoracic series 06/08/2011.
TECHNIQUE: Standard CT cervical spine obtained.

[Series 4: axial · axial · 0.21mm/px · z∈[+405,+519]mm · 5 of 92 slices shown, 7 images (1 of 2)]
[im 16/92  soft-tissue]
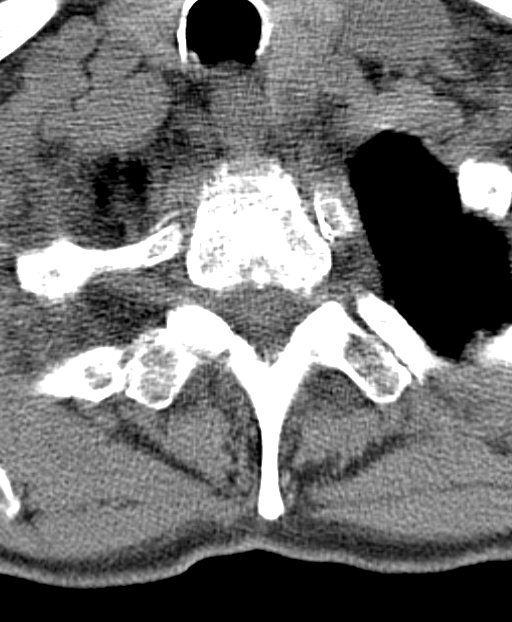
[im 16/92  bone]
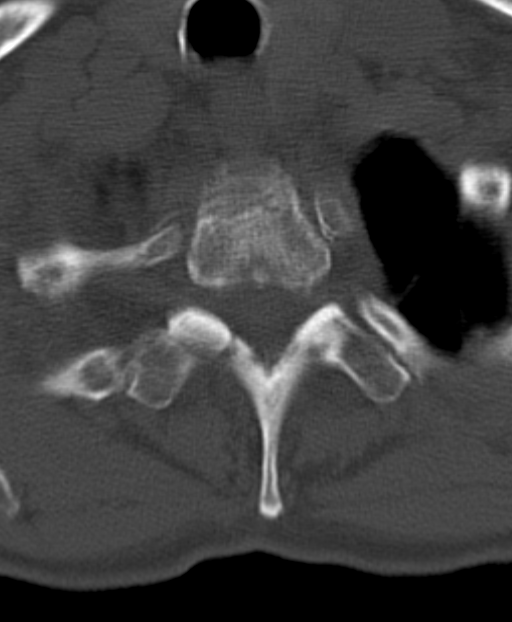
[im 31/92  bone]
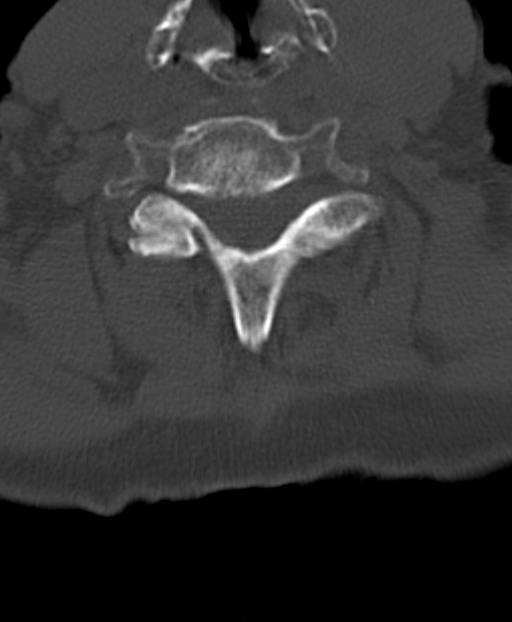
[im 46/92  bone]
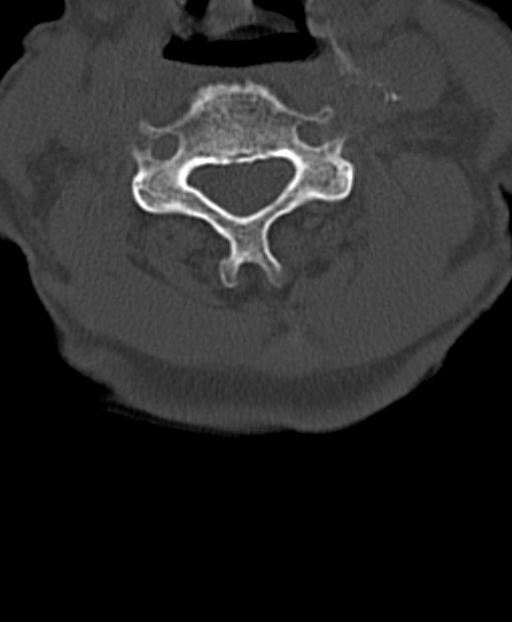
[im 61/92  bone]
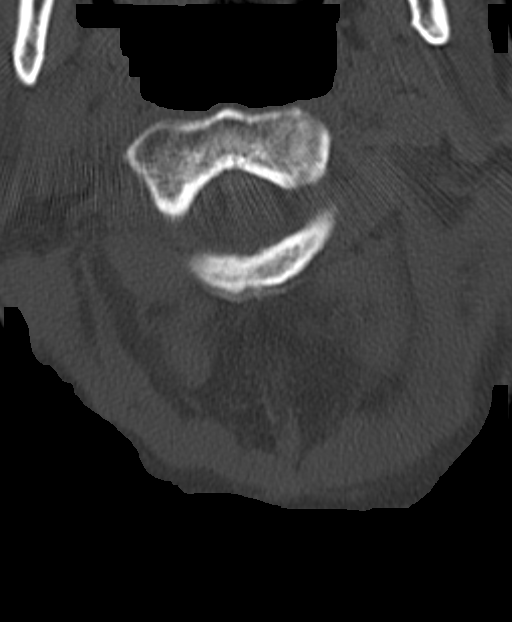
[im 76/92  soft-tissue]
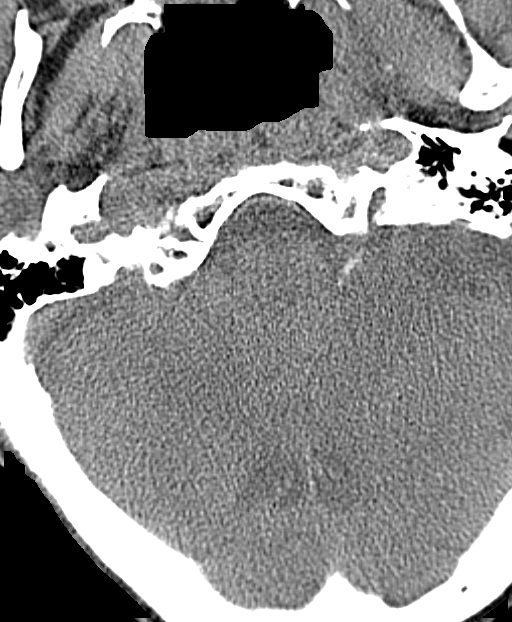
[im 76/92  bone]
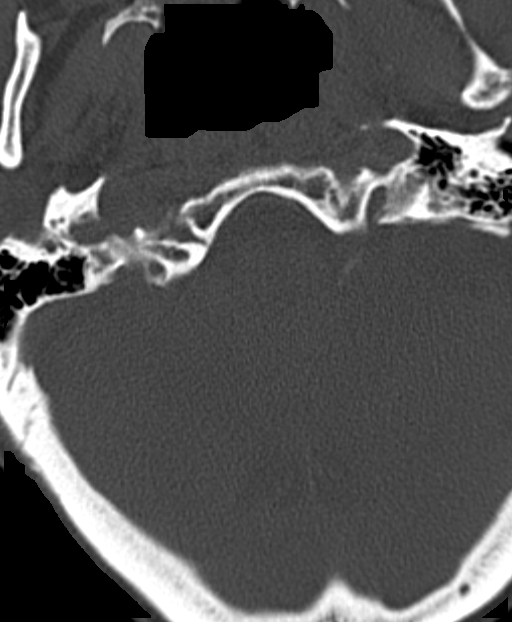

[Series 9: axial · axial · 0.17mm/px · z∈[+406,+517]mm · 5 of 92 slices shown (2 of 2)]
[im 16/92  bone]
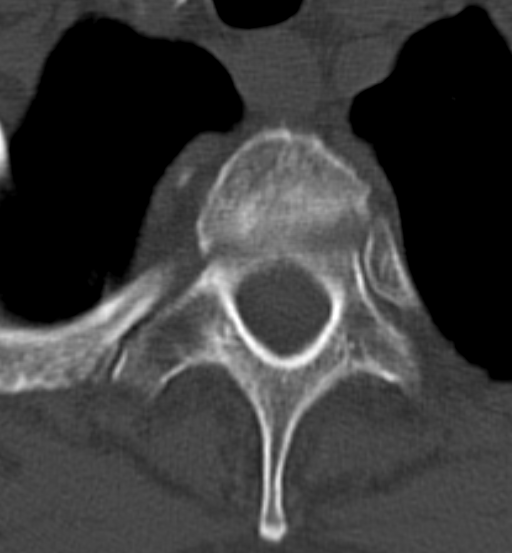
[im 31/92  bone]
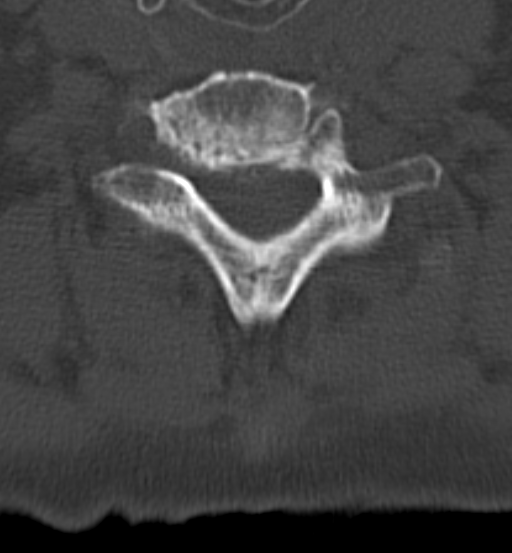
[im 46/92  bone]
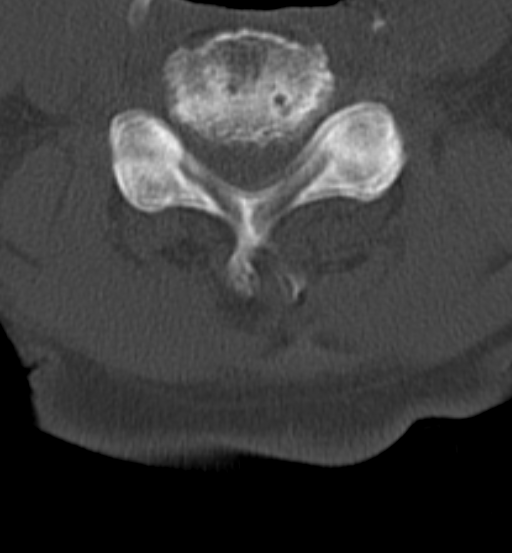
[im 61/92  bone]
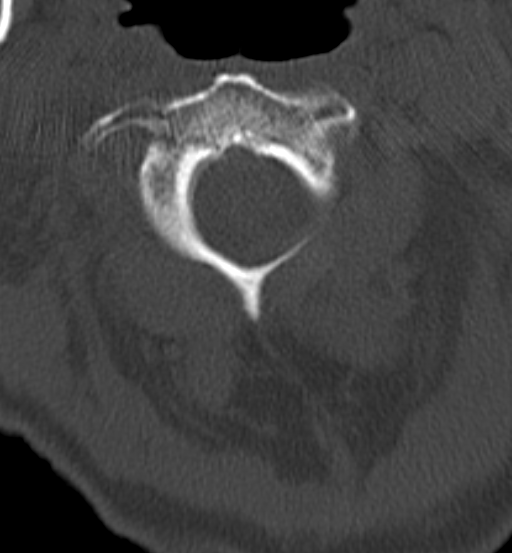
[im 76/92  bone]
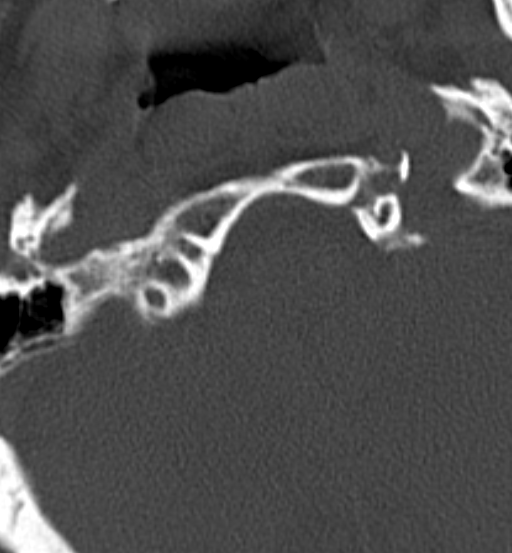

[10 of 14 positions shown; findings below may reference images not displayed]

FINDINGS: Diffuse degenerative change. Mild compression of T12 is present.
Age is undetermined of the compression may be old. No acute fracture
lucencies are noted. No retropulsed fragments. This region was not imaged on
prior cervical spine CT Vascular calcification.
IMPRESSION: Minimal compression T1 vertebral body. This may be old. No
acute cervical spine fracture.

## 2016-12-17 DEATH — deceased
